# Patient Record
Sex: Male | Born: 1975 | Race: Black or African American | Hispanic: No | Marital: Married | Smoking: Never smoker
Health system: Southern US, Community
[De-identification: ages and names within clinical notes are randomized; demographics above are authoritative.]

## PROBLEM LIST (undated history)

## (undated) DIAGNOSIS — I10 Essential (primary) hypertension: Secondary | ICD-10-CM

## (undated) DIAGNOSIS — E119 Type 2 diabetes mellitus without complications: Secondary | ICD-10-CM

---

## 2021-03-05 ENCOUNTER — Emergency Department (HOSPITAL_COMMUNITY): Payer: No Typology Code available for payment source

## 2021-03-05 ENCOUNTER — Emergency Department (HOSPITAL_COMMUNITY): Payer: Self-pay | Attending: Emergency Medicine

## 2021-03-05 ENCOUNTER — Emergency Department (HOSPITAL_COMMUNITY)
Admission: EM | Admit: 2021-03-05 | Discharge: 2021-03-05 | Disposition: A | Discharge status: Home / Self Care | Payer: No Typology Code available for payment source | Attending: Emergency Medicine | Admitting: Emergency Medicine

## 2021-03-05 ENCOUNTER — Other Ambulatory Visit: Payer: Self-pay

## 2021-03-05 ENCOUNTER — Encounter (HOSPITAL_COMMUNITY): Payer: Self-pay

## 2021-03-05 DIAGNOSIS — T1490XA Injury, unspecified, initial encounter: Secondary | ICD-10-CM

## 2021-03-05 DIAGNOSIS — S0993XA Unspecified injury of face, initial encounter: Secondary | ICD-10-CM | POA: Diagnosis present

## 2021-03-05 DIAGNOSIS — S159XXA Injury of unspecified blood vessel at neck level, initial encounter: Secondary | ICD-10-CM | POA: Insufficient documentation

## 2021-03-05 DIAGNOSIS — S0240FA Zygomatic fracture, left side, initial encounter for closed fracture: Secondary | ICD-10-CM

## 2021-03-05 DIAGNOSIS — S79911A Unspecified injury of right hip, initial encounter: Secondary | ICD-10-CM | POA: Insufficient documentation

## 2021-03-05 DIAGNOSIS — E119 Type 2 diabetes mellitus without complications: Secondary | ICD-10-CM | POA: Diagnosis not present

## 2021-03-05 DIAGNOSIS — I10 Essential (primary) hypertension: Secondary | ICD-10-CM | POA: Diagnosis not present

## 2021-03-05 DIAGNOSIS — W19XXXA Unspecified fall, initial encounter: Secondary | ICD-10-CM

## 2021-03-05 DIAGNOSIS — S3992XA Unspecified injury of lower back, initial encounter: Secondary | ICD-10-CM | POA: Insufficient documentation

## 2021-03-05 DIAGNOSIS — S79912A Unspecified injury of left hip, initial encounter: Secondary | ICD-10-CM | POA: Insufficient documentation

## 2021-03-05 DIAGNOSIS — J9811 Atelectasis: Secondary | ICD-10-CM | POA: Diagnosis not present

## 2021-03-05 DIAGNOSIS — S40212A Abrasion of left shoulder, initial encounter: Secondary | ICD-10-CM | POA: Insufficient documentation

## 2021-03-05 DIAGNOSIS — S01312A Laceration without foreign body of left ear, initial encounter: Secondary | ICD-10-CM | POA: Diagnosis not present

## 2021-03-05 DIAGNOSIS — M542 Cervicalgia: Secondary | ICD-10-CM

## 2021-03-05 DIAGNOSIS — S01412A Laceration without foreign body of left cheek and temporomandibular area, initial encounter: Secondary | ICD-10-CM | POA: Insufficient documentation

## 2021-03-05 DIAGNOSIS — Z23 Encounter for immunization: Secondary | ICD-10-CM | POA: Insufficient documentation

## 2021-03-05 DIAGNOSIS — Y990 Civilian activity done for income or pay: Secondary | ICD-10-CM | POA: Diagnosis not present

## 2021-03-05 DIAGNOSIS — S0181XA Laceration without foreign body of other part of head, initial encounter: Secondary | ICD-10-CM

## 2021-03-05 DIAGNOSIS — M549 Dorsalgia, unspecified: Secondary | ICD-10-CM

## 2021-03-05 HISTORY — DX: Type 2 diabetes mellitus without complications: E11.9

## 2021-03-05 HISTORY — DX: Essential (primary) hypertension: I10

## 2021-03-05 LAB — CBC WITH DIFFERENTIAL/PLATELET
Abs Immature Granulocytes: 0.03 10*3/uL (ref 0.00–0.07)
Basophils Absolute: 0 10*3/uL (ref 0.0–0.1)
Basophils Relative: 0 %
Eosinophils Absolute: 0.1 10*3/uL (ref 0.0–0.5)
Eosinophils Relative: 1 %
HCT: 43.4 % (ref 39.0–52.0)
Hemoglobin: 13.8 g/dL (ref 13.0–17.0)
Immature Granulocytes: 0 %
Lymphocytes Relative: 38 %
Lymphs Abs: 3 10*3/uL (ref 0.7–4.0)
MCH: 28.8 pg (ref 26.0–34.0)
MCHC: 31.8 g/dL (ref 30.0–36.0)
MCV: 90.6 fL (ref 80.0–100.0)
Monocytes Absolute: 0.5 10*3/uL (ref 0.1–1.0)
Monocytes Relative: 6 %
Neutro Abs: 4.3 10*3/uL (ref 1.7–7.7)
Neutrophils Relative %: 55 %
Platelets: 298 10*3/uL (ref 150–400)
RBC: 4.79 MIL/uL (ref 4.22–5.81)
RDW: 13 % (ref 11.5–15.5)
WBC: 8 10*3/uL (ref 4.0–10.5)
nRBC: 0 % (ref 0.0–0.2)

## 2021-03-05 LAB — BASIC METABOLIC PANEL
Anion gap: 11 (ref 5–15)
BUN: 14 mg/dL (ref 6–20)
CO2: 23 mmol/L (ref 22–32)
Calcium: 9.2 mg/dL (ref 8.9–10.3)
Chloride: 103 mmol/L (ref 98–111)
Creatinine, Ser: 1.23 mg/dL (ref 0.61–1.24)
GFR, Estimated: >60 mL/min (ref >60)
Glucose, Bld: 144 mg/dL — ABNORMAL HIGH (ref 70–99)
Potassium: 3.6 mmol/L (ref 3.5–5.1)
Sodium: 137 mmol/L (ref 135–145)

## 2021-03-05 LAB — PROTIME-INR
INR: 1 (ref 0.8–1.2)
Prothrombin Time: 13.6 seconds (ref 11.4–15.2)

## 2021-03-05 LAB — APTT: aPTT: 26 seconds (ref 24–36)

## 2021-03-05 MED ORDER — HYDROMORPHONE HCL 1 MG/ML IJ SOLN
1.0000 mg | Freq: Once | INTRAMUSCULAR | Status: AC
  Administered 2021-03-05: 1 mg via INTRAVENOUS
  Filled 2021-03-05: qty 1

## 2021-03-05 MED ORDER — OXYCODONE-ACETAMINOPHEN 5-325 MG PO TABS: 1.0000 | ORAL_TABLET | Freq: Four times a day (QID) | ORAL | 0 refills | Status: AC | PRN

## 2021-03-05 MED ORDER — ONDANSETRON HCL 4 MG/2ML IJ SOLN: 4.0000 mg | Freq: Once | INTRAMUSCULAR | Status: AC

## 2021-03-05 MED ORDER — ONDANSETRON HCL 4 MG/2ML IJ SOLN
INTRAMUSCULAR | Status: AC
  Administered 2021-03-05: 4 mg via INTRAVENOUS
  Filled 2021-03-05: qty 2

## 2021-03-05 MED ORDER — IOHEXOL 350 MG/ML SOLN
50.0000 mL | Freq: Once | INTRAVENOUS | Status: AC | PRN
  Administered 2021-03-05: 50 mL via INTRAVENOUS

## 2021-03-05 MED ORDER — LIDOCAINE-EPINEPHRINE (PF) 2 %-1:200000 IJ SOLN
20.0000 mL | Freq: Once | INTRAMUSCULAR | Status: AC
  Administered 2021-03-05: 20 mL
  Filled 2021-03-05: qty 20

## 2021-03-05 MED ORDER — AMOXICILLIN-POT CLAVULANATE 875-125 MG PO TABS: 1.0000 | ORAL_TABLET | Freq: Two times a day (BID) | ORAL | 0 refills | Status: AC

## 2021-03-05 MED ORDER — TETANUS-DIPHTH-ACELL PERTUSSIS 5-2.5-18.5 LF-MCG/0.5 IM SUSY
0.5000 mL | PREFILLED_SYRINGE | Freq: Once | INTRAMUSCULAR | Status: AC
  Administered 2021-03-05: 0.5 mL via INTRAMUSCULAR
  Filled 2021-03-05: qty 0.5

## 2021-03-05 NOTE — Discharge Instructions (Addendum)
You came to the emergency department today to be evaluated for your injuries after suffering a fall.  Your physical exam was reassuring.  Your CT scan showed that you had a fracture to your zygomatic arch on the left side.  Your head, neck, and back CT scans were unremarkable.  Due to your zygomatic arch fracture you will need to follow-up with an ENT physician.  Please call their office tomorrow to schedule appointment for follow-up.  Please follow-up with your primary care provider if your symptoms do not improve.  Please take Ibuprofen (Advil, motrin) and Tylenol (acetaminophen) to relieve your pain.    You may take up to 600 MG (3 pills) of normal strength ibuprofen every 8 hours as needed.   You make take tylenol, up to 1,000 mg (two extra strength pills) every 8 hours as needed.   It is safe to take ibuprofen and tylenol at the same time as they work differently.   Do not take more than 3,000 mg tylenol in a 24 hour period (not more than one dose every 8 hours.  Please check all medication labels as many medications such as pain and cold medications may contain tylenol.  Do not drink alcohol while taking these medications.  Do not take other NSAID'S while taking ibuprofen (such as aleve or naproxen).  Please take ibuprofen with food to decrease stomach upset.  You may have diarrhea from the antibiotics.  It is very important that you continue to take the antibiotics even if you get diarrhea unless a medical professional tells you that you may stop taking them.  If you stop too early the bacteria you are being treated for will become stronger and you may need different, more powerful antibiotics that have more side effects and worsening diarrhea.  Please stay well hydrated and consider probiotics as they may decrease the severity of your diarrhea.    Get help right away if: You develop new bowel or bladder control problems. You have unusual weakness or numbness in your arms or legs. You  develop nausea or vomiting. You develop abdominal pain. You feel faint.

## 2021-03-05 NOTE — ED Provider Notes (Signed)
..Laceration Repair  Date/Time: 03/05/2021 5:15 PM Performed by: Renne Crigler, PA-C Authorized by: Renne Crigler, PA-C   Consent:    Consent obtained:  Verbal   Consent given by:  Patient   Risks, benefits, and alternatives were discussed: yes     Risks discussed:  Infection, pain, nerve damage, need for additional repair, poor cosmetic result, poor wound healing, vascular damage, tendon damage and retained foreign body   Alternatives discussed:  No treatment Universal protocol:    Procedure explained and questions answered to patient or proxy's satisfaction: yes     Patient identity confirmed:  Verbally with patient, arm band and provided demographic data Anesthesia:    Anesthesia method:  Local infiltration   Local anesthetic:  Lidocaine 2% WITH epi Laceration details:    Location:  Face   Face location:  L cheek   Length (cm):  4 Pre-procedure details:    Preparation:  Patient was prepped and draped in usual sterile fashion and imaging obtained to evaluate for foreign bodies Exploration:    Hemostasis achieved with:  Direct pressure and epinephrine   Imaging obtained: x-ray     Imaging outcome: foreign body not noted     Wound exploration: wound explored through full range of motion and entire depth of wound visualized     Wound extent: no foreign bodies/material noted and no muscle damage noted     Contaminated: no   Treatment:    Area cleansed with:  Saline   Amount of cleaning:  Extensive   Irrigation solution:  Sterile saline   Debridement:  None   Undermining:  None Skin repair:    Repair method:  Sutures   Suture size:  5-0   Suture material:  Prolene   Suture technique:  Simple interrupted   Number of sutures:  7 Approximation:    Approximation:  Close Repair type:    Repair type:  Simple Post-procedure details:    Dressing:  Open (no dressing)   Procedure completion:  Tolerated well, no immediate complications  .Marland KitchenLaceration Repair  Date/Time:  03/05/2021 5:15 PM Performed by: Renne Crigler, PA-C Authorized by: Renne Crigler, PA-C   Consent:    Consent obtained:  Verbal   Consent given by:  Patient   Risks, benefits, and alternatives were discussed: yes     Risks discussed:  Infection, pain, nerve damage, need for additional repair, poor cosmetic result, poor wound healing, vascular damage, tendon damage and retained foreign body   Alternatives discussed:  No treatment Universal protocol:    Procedure explained and questions answered to patient or proxy's satisfaction: yes     Patient identity confirmed:  Verbally with patient, arm band and provided demographic data Anesthesia:    Anesthesia method:  Local infiltration   Local anesthetic:  Lidocaine 2% WITH epi Laceration details:    Location:  Face   Face location:  L cheek   Length (cm):  6 Pre-procedure details:    Preparation:  Patient was prepped and draped in usual sterile fashion and imaging obtained to evaluate for foreign bodies Exploration:    Hemostasis achieved with:  Direct pressure and epinephrine   Imaging obtained: x-ray     Imaging outcome: foreign body not noted     Wound exploration: wound explored through full range of motion and entire depth of wound visualized     Wound extent: no foreign bodies/material noted and no muscle damage noted     Contaminated: no   Treatment:    Area cleansed  with:  Saline   Amount of cleaning:  Extensive   Irrigation solution:  Sterile saline   Debridement:  None   Undermining:  None   Layers/structures repaired:  Deep subcutaneous Deep subcutaneous:    Suture size:  5-0   Suture material:  Vicryl   Suture technique:  Simple interrupted   Number of sutures:  2 Skin repair:    Repair method:  Sutures   Suture size:  5-0   Suture material:  Prolene   Suture technique:  Simple interrupted   Number of sutures:  11 Approximation:    Approximation:  Close Repair type:    Repair type:  Simple Post-procedure  details:    Dressing:  Open (no dressing)   Procedure completion:  Tolerated well, no immediate complications    .Marland KitchenLaceration Repair  Date/Time: 03/05/2021 5:15 PM Performed by: Renne Crigler, PA-C Authorized by: Renne Crigler, PA-C   Consent:    Consent obtained:  Verbal   Consent given by:  Patient   Risks, benefits, and alternatives were discussed: yes     Risks discussed:  Infection, pain, nerve damage, need for additional repair, poor cosmetic result, poor wound healing, vascular damage, tendon damage and retained foreign body   Alternatives discussed:  No treatment Universal protocol:    Procedure explained and questions answered to patient or proxy's satisfaction: yes     Patient identity confirmed:  Verbally with patient, arm band and provided demographic data Anesthesia:    Anesthesia method:  Local infiltration   Local anesthetic:  Lidocaine 2% WITH epi Laceration details:    Location:  Face   Face location:  Posterior to L ear   Length (cm):  1 Pre-procedure details:    Preparation:  Patient was prepped and draped in usual sterile fashion and imaging obtained to evaluate for foreign bodies Exploration:    Hemostasis achieved with:  Direct pressure and epinephrine   Imaging obtained: x-ray     Imaging outcome: foreign body not noted     Wound exploration: wound explored through full range of motion and entire depth of wound visualized     Wound extent: no foreign bodies/material noted and no muscle damage noted     Contaminated: no   Treatment:    Area cleansed with:  Saline   Amount of cleaning:  Extensive   Irrigation solution:  Sterile saline   Debridement:  None   Undermining:  None   Skin repair:    Repair method:  Sutures   Suture size:  5-0   Suture material:  Prolene   Suture technique:  Simple interrupted   Number of sutures:  2 Approximation:    Approximation:  Close Repair type:    Repair type:  Simple Post-procedure details:    Dressing:   Open (no dressing)   Procedure completion:  Tolerated well, no immediate complications      Renne Crigler, PA-C 03/05/21 1719    Cathren Laine, MD 03/05/21 1801

## 2021-03-05 NOTE — ED Provider Notes (Signed)
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 disturbance, nausea, vomiting, chest pain, abdominal pain.  Patient is not on any blood thinners.  Patient is unsure when his last tetanus shot was.  Physical exam as noted above.  Will obtain noncontrast CT of head, maxillofacial and cervical neck.  Will obtain contrast CT abdomen and chest.  CT imaging of thoracic and lumbar spine.  Will obtain x-ray of left shoulder, left elbow, left hand and bilateral hips/pelvis.  BMP, CBC, INR, APTT ordered.  Patient given Dilaudid for pain management.  Lab work is unremarkable.  CT of head, maxillofacial, and cervical neck show: -No acute intracranial abnormalities. -Small acute fracture involving the lateral cortex of the left zygomatic arch -Large laceration involving the left side of face -No evidence for cervical spine fracture or dislocation -Cervical degenerative disc disease  CT lumbar and thoracic spine shows no evidence of acute osseous injury in the thoracic or lumbar spine.  Normal alignment.  No significant spondylosis.  CT chest, abdomen and pelvis shows no evidence of  acute injury within the chest, abdomen or pelvis.  Mild dependent atelectasis in both lungs.  DG left and right hip show no evidence of hip fracture, dislocation, arthropathy, or other focal bone abnormality.  DG left shoulder, left elbow and left hand shows no evidence of fracture, dislocation, arthropathy, other focal bone abnormality, soft tissues are unremarkable.  1515 Spoke with on-call maxillofacial surgeon Dr. Suszanne Connerseoh due to patient's zygomatic arch fracture and concern for possible open fracture.  He advised that he will see the patient in the outpatient setting.  Recommended starting patient on Augmentin or clindamycin.  Advised to have lacerations repaired in emergency department.  Plan to discharge patient on 7-day course of Augmentin as well as short course of  Percocet.  Patient is to follow-up with Dr. Suszanne Conners.  Patient is to follow-up with his primary care provider as needed if his symptoms do not improve.  Patient was discussed with and evaluated by Dr. Wilkie Aye  Patient care transferred to PA Geiple at the end of my shift. Patient presentation, ED course, and plan of care discussed with review of all pertinent labs and imaging. Please see his/her note for further details regarding further ED course and disposition.    Final Clinical Impression(s) / ED Diagnoses Final diagnoses:  None    Rx / DC Orders ED Discharge Orders     None        Haskel Schroeder, PA-C 03/05/21 1907    Rozelle Logan, DO 03/06/21 0815

## 2021-03-05 NOTE — ED Triage Notes (Signed)
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 The truck was hit and pt was knocked out of bucket and had harness catch him. Pt was then brought to the ground by workers. Pt presents with lac to L cheek, L eye, L ear, with pain in L arm and L side of neck. L knee and L shoulder abrasions noted. Pt in [employer] and aox4

## 2022-05-07 IMAGING — CT CT MAXILLOFACIAL W/O CM
3 of 4 series · 14 of 47 positions shown, 16 images · non-contrast
Comparison: None
COMPARISON: None

Addendum:
CLINICAL DATA: Injury while at work. Patient fell from bucket
truck.

EXAM:
CT HEAD WITHOUT CONTRAST
CT MAXILLOFACIAL WITHOUT CONTRAST
CT CERVICAL SPINE WITHOUT CONTRAST
TECHNIQUE: Multidetector CT imaging of the head, cervical spine, and
maxillofacial structures were performed using the standard protocol
without intravenous contrast. Multiplanar CT image reconstructions
of the cervical spine and maxillofacial structures were also
generated.

[Series 3: facial/ orbits 2.0 h30s · axial · 0.35mm/px · z∈[-191,-53]mm · 8 of 89 slices shown, 10 images]
[im 10/89  brain]
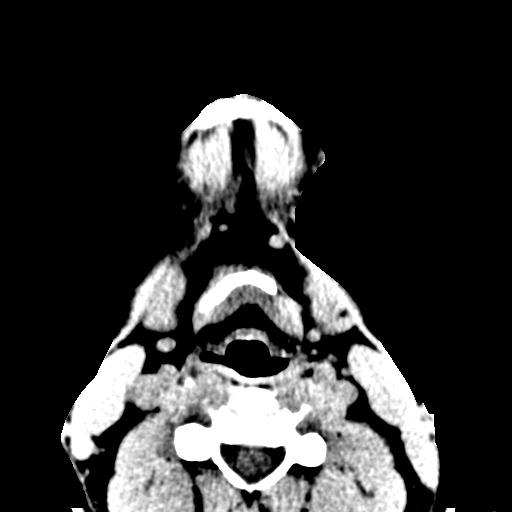
[im 10/89  bone]
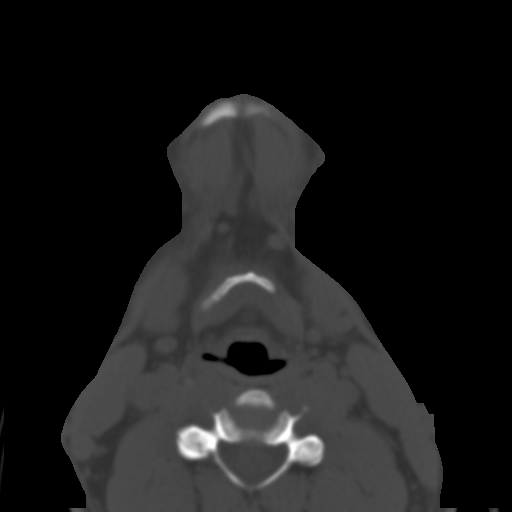
[im 20/89  bone]
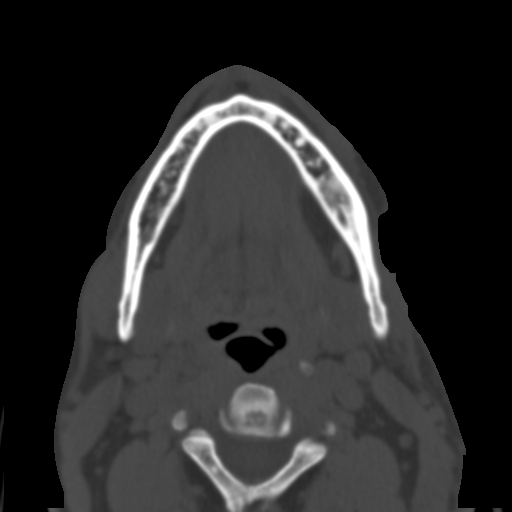
[im 30/89  bone]
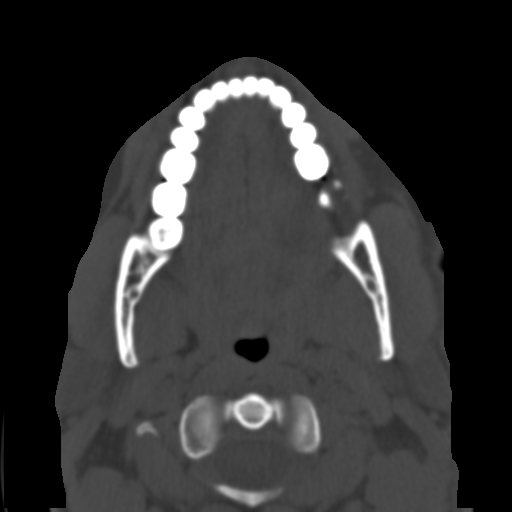
[im 40/89  bone]
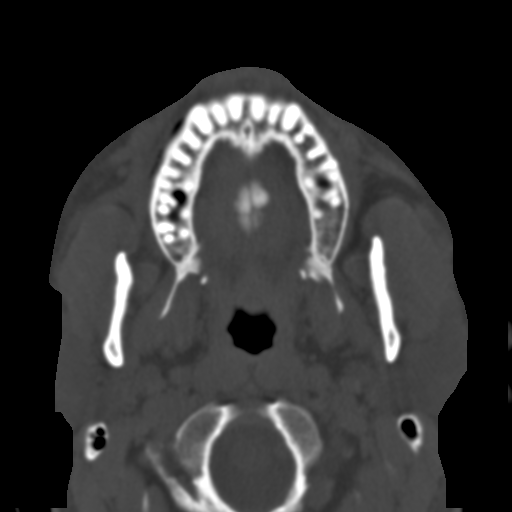
[im 49/89  brain]
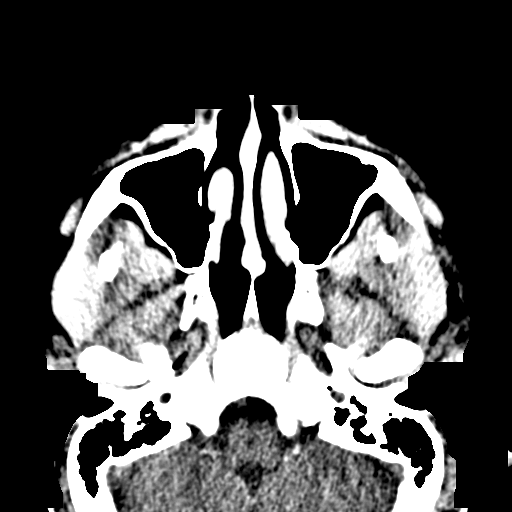
[im 49/89  bone]
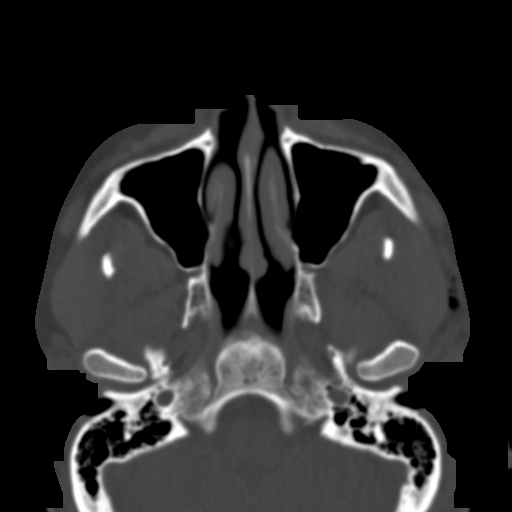
[im 59/89  bone]
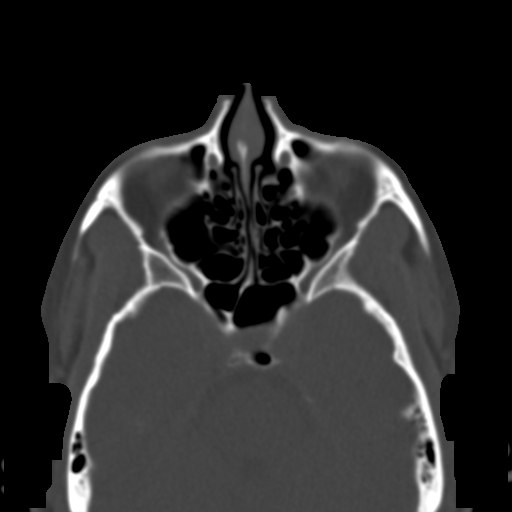
[im 69/89  bone]
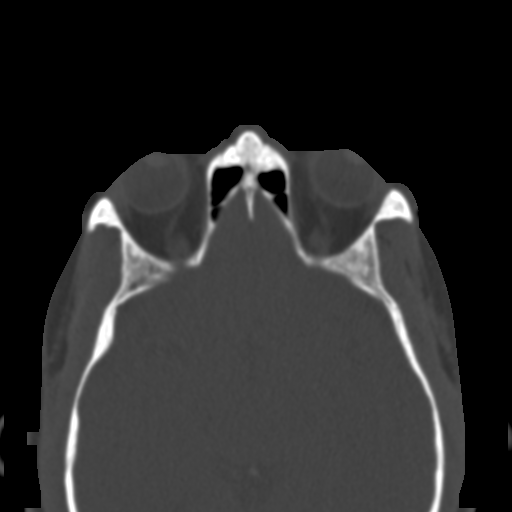
[im 79/89  bone]
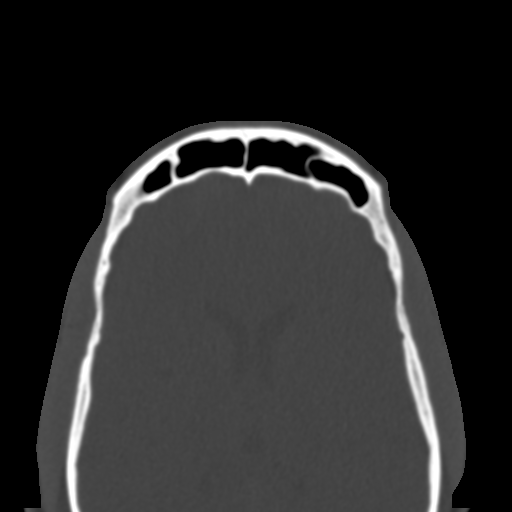

[Series 7: coronal soft tissue · coronal · 0.35mm/px · 3 of 76 slices shown]
[im 26/76  bone]
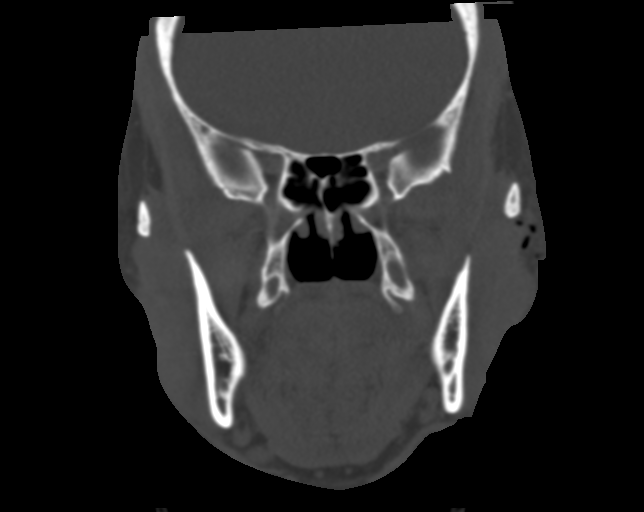
[im 34/76  bone]
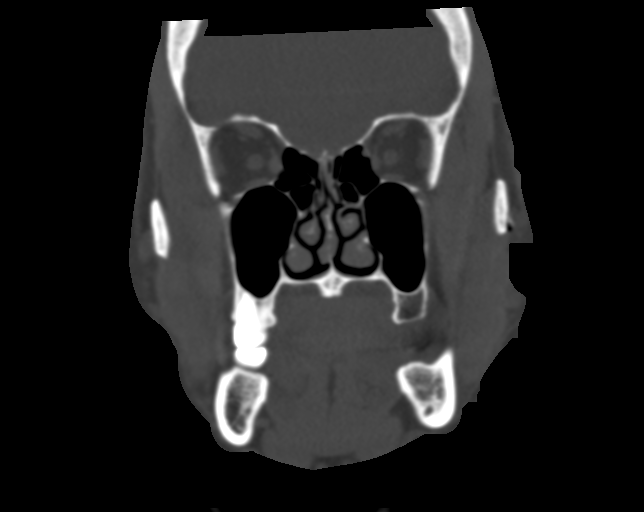
[im 42/76  bone]
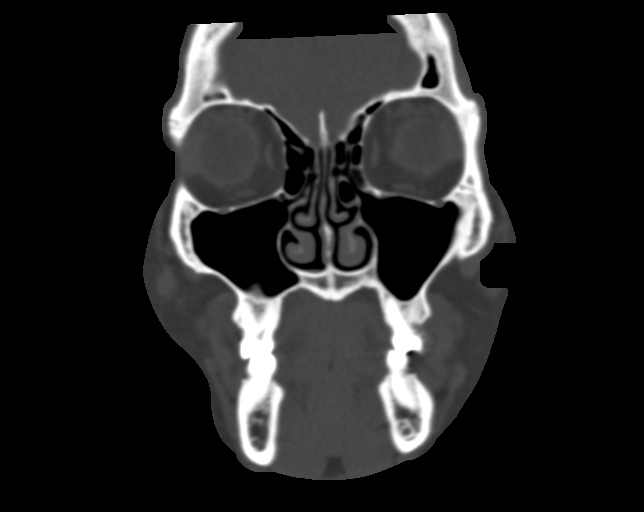

[Series 8: sagittal soft tissue · sagittal · 0.35mm/px · 3 of 111 slices shown]
[im 37/111  bone]
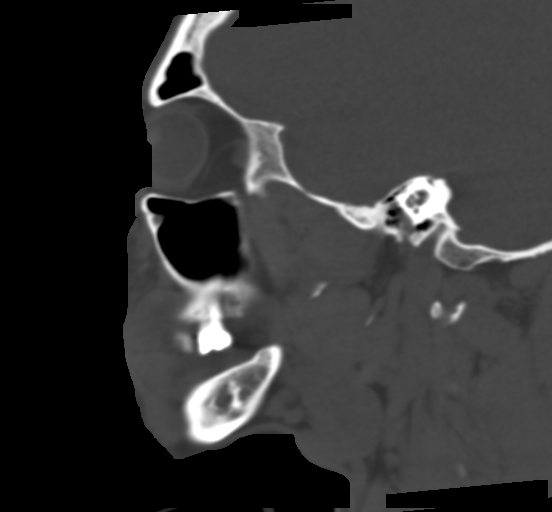
[im 56/111  bone]
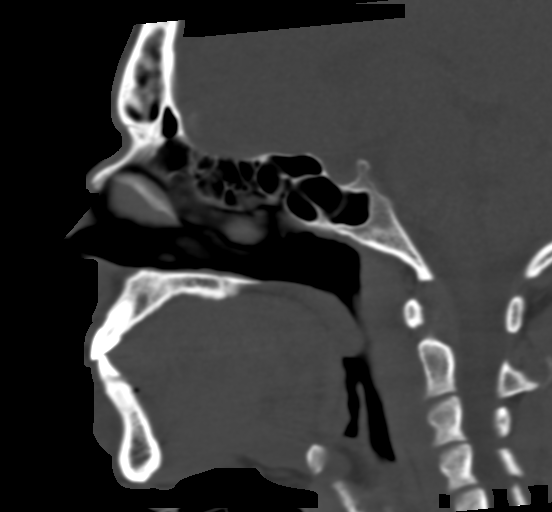
[im 74/111  bone]
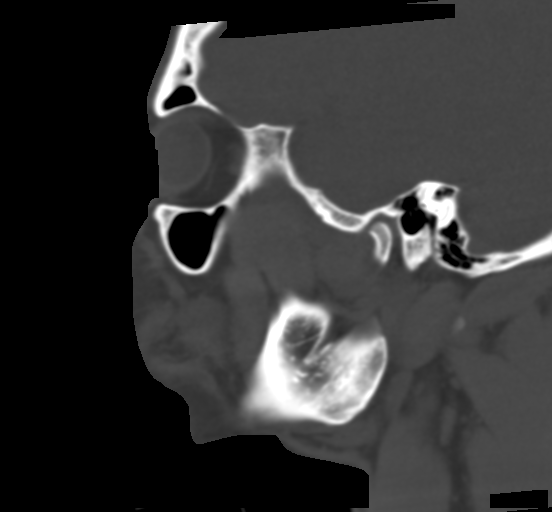

[14 of 47 positions shown; findings below may reference images not displayed]

FINDINGS: CT HEAD FINDINGS

Brain: No evidence of acute infarction, hemorrhage, hydrocephalus,
extra-axial collection or mass lesion/mass effect.

Vascular: No hyperdense vessel or unexpected calcification.

Skull: Normal. Negative for fracture or focal lesion.

Other: Subcutaneous soft tissue gas is identified overlying the left
side of face and left temporal bone.

CT MAXILLOFACIAL FINDINGS

Osseous: There is a small acute fracture involving the lateral
cortex of the left zygomatic arch. This does not extend through the
entirety of the zygomatic bone. The linear fracture fragment runs
parallel to the zygomatic bone and does not extend through the
entirety of the lateral cortex, image 104/8. No additional fractures
identified.

Orbits: Negative. No traumatic or inflammatory finding.

Sinuses: The paranasal sinuses are clear. Mastoid air cells are
clear.

Soft tissues: There is a large laceration involving the left side of
face, image 44/3. Subcutaneous gas is identified tracking up to the
left zygomatic bone.

CT CERVICAL SPINE FINDINGS

Alignment: Normal.

Skull base and vertebrae: No acute fracture. No primary bone lesion
or focal pathologic process.

Soft tissues and spinal canal: No prevertebral fluid or swelling. No
visible canal hematoma.

Disc levels:  Degenerative disc disease identified at C5-6 and C6-7.

Upper chest: Negative.

Other: None
IMPRESSION: 1. No acute intracranial abnormalities.
2. Small acute fracture involving the lateral cortex of the left
zygomatic arch.
3. Large laceration involving the left side of face.
4. No evidence for cervical spine fracture or dislocation.
5. Cervical degenerative disc disease.

ADDENDUM:
After further review of the cervical spine images with Dr. Being,
there appears to be a nondisplaced fracture of C7 on the right. The
fracture involves the base of the transverse process extending into
the transverse foramen and extending into the superior articulating
facet of C7 which is nondisplaced. Normal alignment of the facet
joint. Right neural foramen widely patent. No other fracture
identified.

*** End of Addendum ***
FINDINGS: CT HEAD FINDINGS

Brain: No evidence of acute infarction, hemorrhage, hydrocephalus,
extra-axial collection or mass lesion/mass effect.

Vascular: No hyperdense vessel or unexpected calcification.

Skull: Normal. Negative for fracture or focal lesion.

Other: Subcutaneous soft tissue gas is identified overlying the left
side of face and left temporal bone.

CT MAXILLOFACIAL FINDINGS

Osseous: There is a small acute fracture involving the lateral
cortex of the left zygomatic arch. This does not extend through the
entirety of the zygomatic bone. The linear fracture fragment runs
parallel to the zygomatic bone and does not extend through the
entirety of the lateral cortex, image 104/8. No additional fractures
identified.

Orbits: Negative. No traumatic or inflammatory finding.

Sinuses: The paranasal sinuses are clear. Mastoid air cells are
clear.

Soft tissues: There is a large laceration involving the left side of
face, image 44/3. Subcutaneous gas is identified tracking up to the
left zygomatic bone.

CT CERVICAL SPINE FINDINGS

Alignment: Normal.

Skull base and vertebrae: No acute fracture. No primary bone lesion
or focal pathologic process.

Soft tissues and spinal canal: No prevertebral fluid or swelling. No
visible canal hematoma.

Disc levels:  Degenerative disc disease identified at C5-6 and C6-7.

Upper chest: Negative.

Other: None
IMPRESSION: 1. No acute intracranial abnormalities.
2. Small acute fracture involving the lateral cortex of the left
zygomatic arch.
3. Large laceration involving the left side of face.
4. No evidence for cervical spine fracture or dislocation.
5. Cervical degenerative disc disease.

## 2022-05-07 IMAGING — CT CT HEAD W/O CM
2 of 3 series · 13 of 47 positions shown, 16 images · non-contrast
Comparison: None
COMPARISON: None

Addendum:
CLINICAL DATA: Injury while at work. Patient fell from bucket
truck.

EXAM:
CT HEAD WITHOUT CONTRAST
CT MAXILLOFACIAL WITHOUT CONTRAST
CT CERVICAL SPINE WITHOUT CONTRAST
TECHNIQUE: Multidetector CT imaging of the head, cervical spine, and
maxillofacial structures were performed using the standard protocol
without intravenous contrast. Multiplanar CT image reconstructions
of the cervical spine and maxillofacial structures were also
generated.

[Series 3: head 5.0 h30s · axial · 0.43mm/px · z∈[-109,+21]mm · 10 of 32 slices shown, 13 images]
[im 3/32  brain]
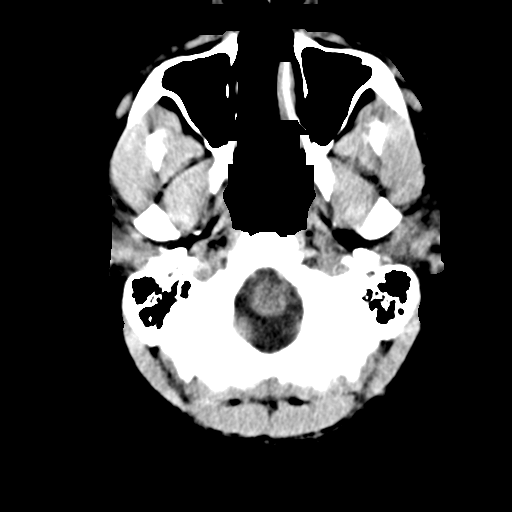
[im 3/32  bone]
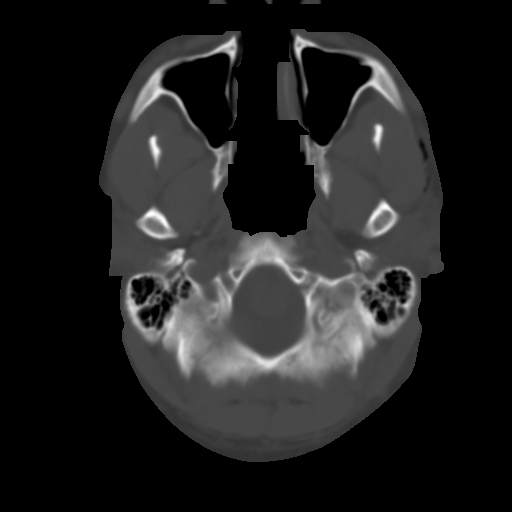
[im 6/32  brain]
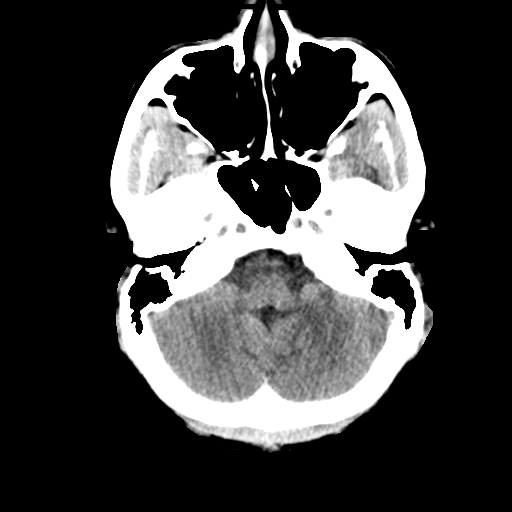
[im 9/32  brain]
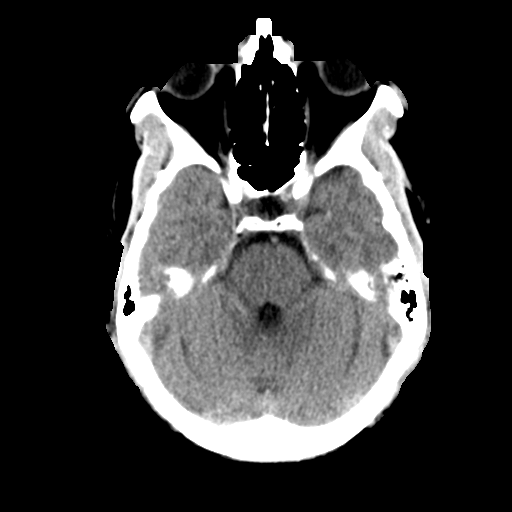
[im 11/32  brain]
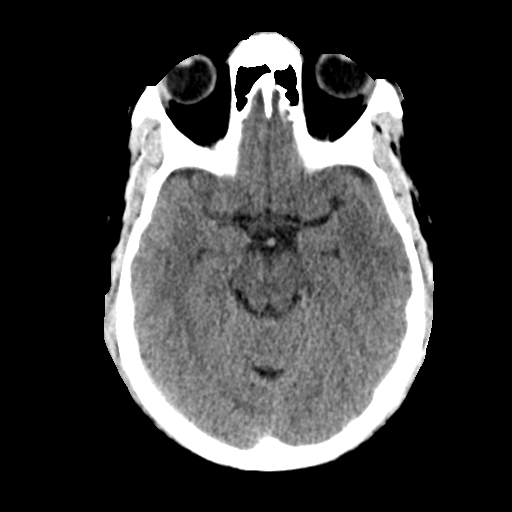
[im 14/32  brain]
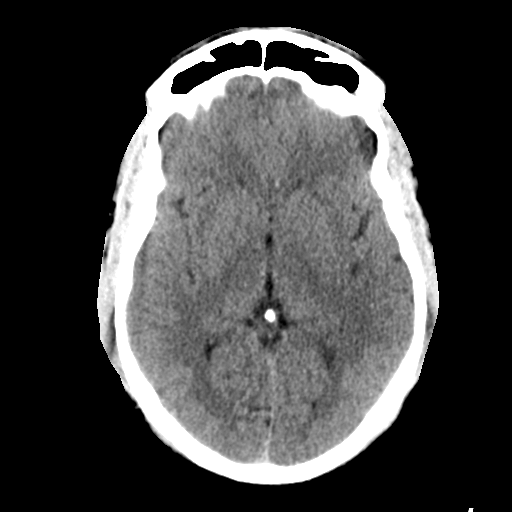
[im 14/32  bone]
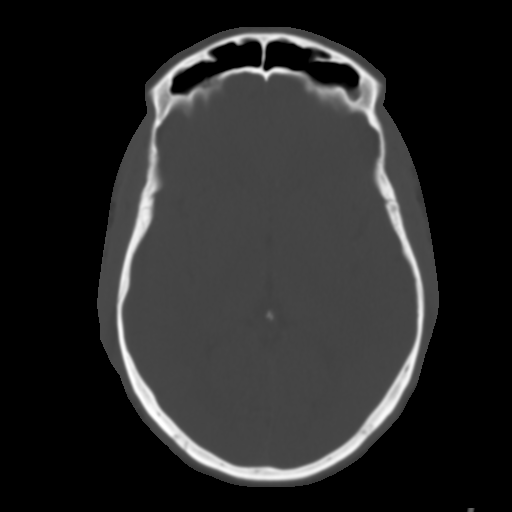
[im 18/32  brain]
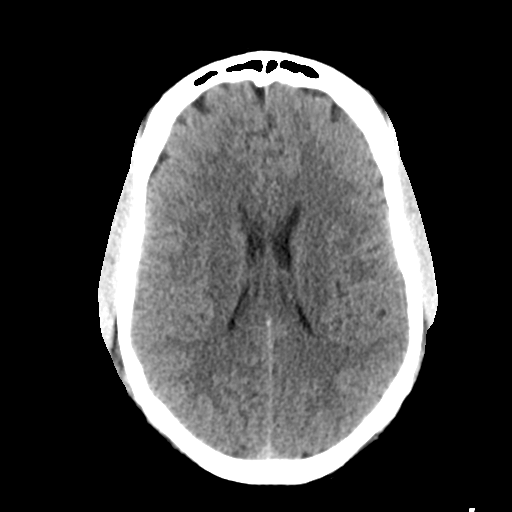
[im 21/32  brain]
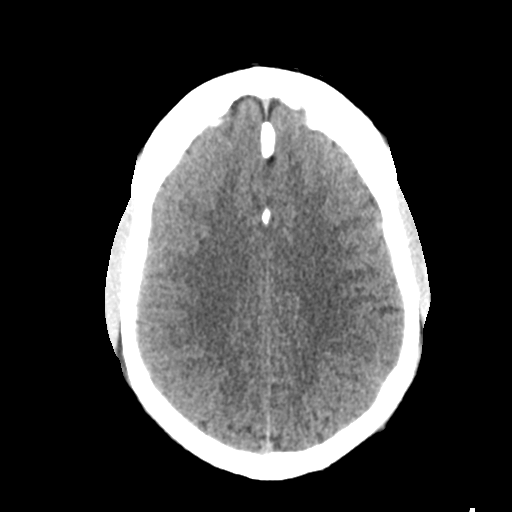
[im 24/32  brain]
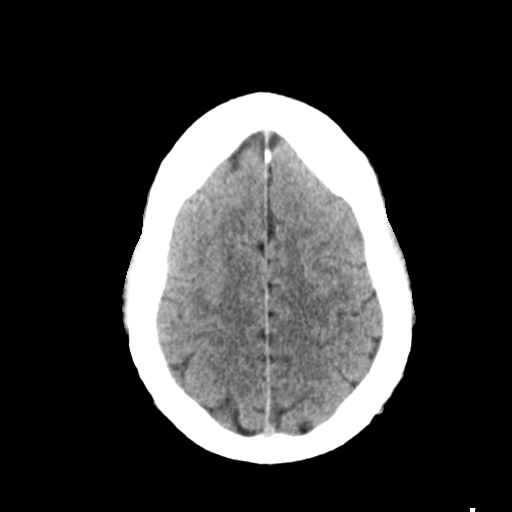
[im 26/32  brain]
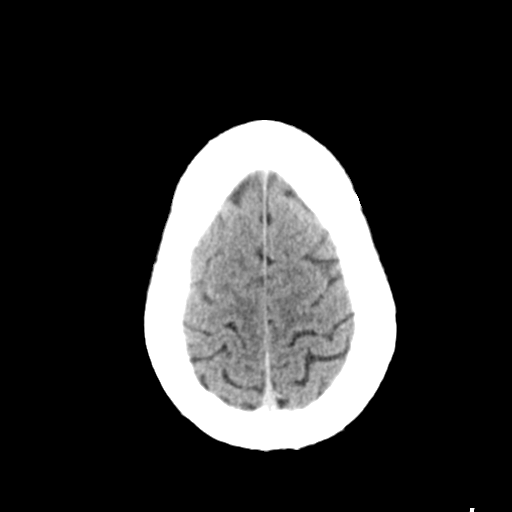
[im 26/32  bone]
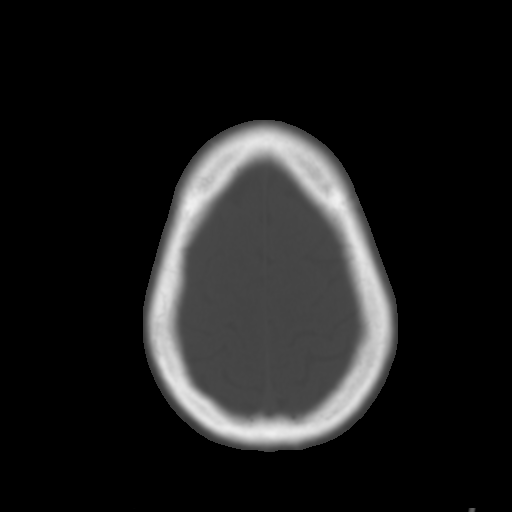
[im 29/32  brain]
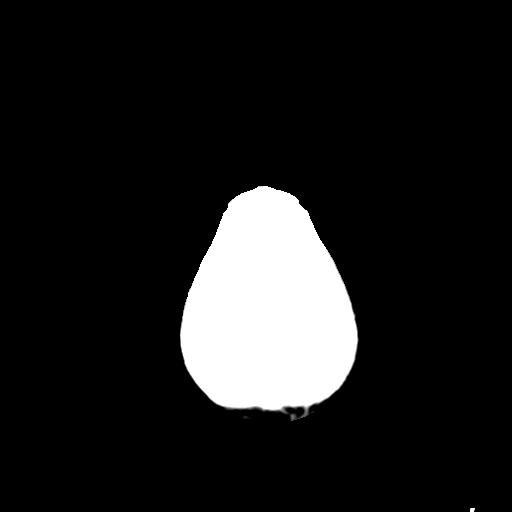

[Series 5: head 3.0 mpr cor · coronal · 0.31mm/px · 3 of 67 slices shown]
[im 23/67  brain]
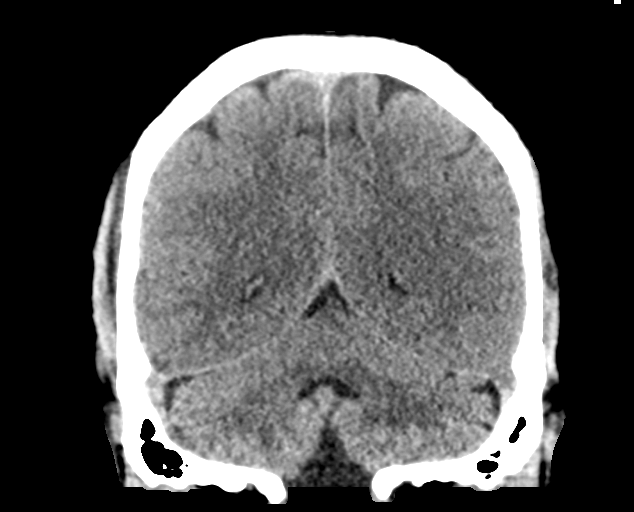
[im 30/67  brain]
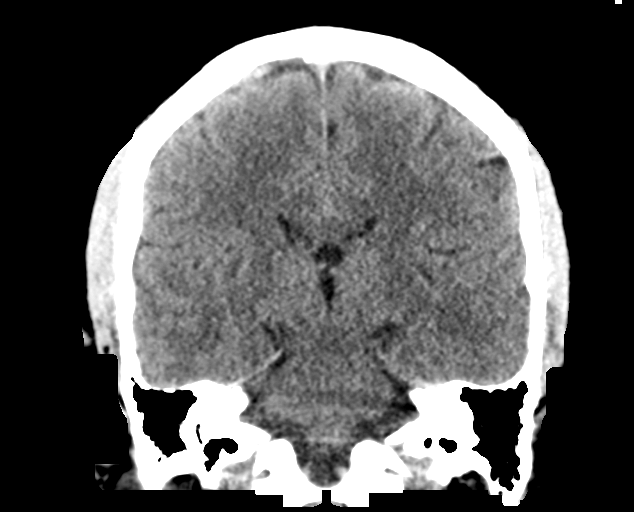
[im 37/67  brain]
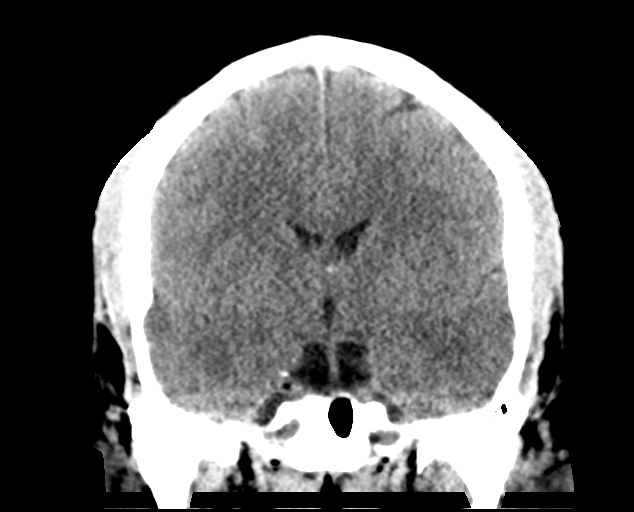

[13 of 47 positions shown; findings below may reference images not displayed]

FINDINGS: CT HEAD FINDINGS

Brain: No evidence of acute infarction, hemorrhage, hydrocephalus,
extra-axial collection or mass lesion/mass effect.

Vascular: No hyperdense vessel or unexpected calcification.

Skull: Normal. Negative for fracture or focal lesion.

Other: Subcutaneous soft tissue gas is identified overlying the left
side of face and left temporal bone.

CT MAXILLOFACIAL FINDINGS

Osseous: There is a small acute fracture involving the lateral
cortex of the left zygomatic arch. This does not extend through the
entirety of the zygomatic bone. The linear fracture fragment runs
parallel to the zygomatic bone and does not extend through the
entirety of the lateral cortex, image 104/8. No additional fractures
identified.

Orbits: Negative. No traumatic or inflammatory finding.

Sinuses: The paranasal sinuses are clear. Mastoid air cells are
clear.

Soft tissues: There is a large laceration involving the left side of
face, image 44/3. Subcutaneous gas is identified tracking up to the
left zygomatic bone.

CT CERVICAL SPINE FINDINGS

Alignment: Normal.

Skull base and vertebrae: No acute fracture. No primary bone lesion
or focal pathologic process.

Soft tissues and spinal canal: No prevertebral fluid or swelling. No
visible canal hematoma.

Disc levels:  Degenerative disc disease identified at C5-6 and C6-7.

Upper chest: Negative.

Other: None
IMPRESSION: 1. No acute intracranial abnormalities.
2. Small acute fracture involving the lateral cortex of the left
zygomatic arch.
3. Large laceration involving the left side of face.
4. No evidence for cervical spine fracture or dislocation.
5. Cervical degenerative disc disease.

ADDENDUM:
After further review of the cervical spine images with Dr. Being,
there appears to be a nondisplaced fracture of C7 on the right. The
fracture involves the base of the transverse process extending into
the transverse foramen and extending into the superior articulating
facet of C7 which is nondisplaced. Normal alignment of the facet
joint. Right neural foramen widely patent. No other fracture
identified.

*** End of Addendum ***
FINDINGS: CT HEAD FINDINGS

Brain: No evidence of acute infarction, hemorrhage, hydrocephalus,
extra-axial collection or mass lesion/mass effect.

Vascular: No hyperdense vessel or unexpected calcification.

Skull: Normal. Negative for fracture or focal lesion.

Other: Subcutaneous soft tissue gas is identified overlying the left
side of face and left temporal bone.

CT MAXILLOFACIAL FINDINGS

Osseous: There is a small acute fracture involving the lateral
cortex of the left zygomatic arch. This does not extend through the
entirety of the zygomatic bone. The linear fracture fragment runs
parallel to the zygomatic bone and does not extend through the
entirety of the lateral cortex, image 104/8. No additional fractures
identified.

Orbits: Negative. No traumatic or inflammatory finding.

Sinuses: The paranasal sinuses are clear. Mastoid air cells are
clear.

Soft tissues: There is a large laceration involving the left side of
face, image 44/3. Subcutaneous gas is identified tracking up to the
left zygomatic bone.

CT CERVICAL SPINE FINDINGS

Alignment: Normal.

Skull base and vertebrae: No acute fracture. No primary bone lesion
or focal pathologic process.

Soft tissues and spinal canal: No prevertebral fluid or swelling. No
visible canal hematoma.

Disc levels:  Degenerative disc disease identified at C5-6 and C6-7.

Upper chest: Negative.

Other: None
IMPRESSION: 1. No acute intracranial abnormalities.
2. Small acute fracture involving the lateral cortex of the left
zygomatic arch.
3. Large laceration involving the left side of face.
4. No evidence for cervical spine fracture or dislocation.
5. Cervical degenerative disc disease.
